# Patient Record
Sex: Female | Born: 2005 | Race: Black or African American | Hispanic: No | Marital: Single | Smoking: Never smoker
Health system: Southern US, Community
[De-identification: ages and names within clinical notes are randomized; demographics above are authoritative.]

## PROBLEM LIST (undated history)

## (undated) DIAGNOSIS — T7840XA Allergy, unspecified, initial encounter: Secondary | ICD-10-CM

## (undated) HISTORY — DX: Allergy, unspecified, initial encounter: T78.40XA

---
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
## 2024-03-05 ENCOUNTER — Ambulatory Visit
Admission: EM | Admit: 2024-03-05 | Discharge: 2024-03-05 | Disposition: A | Discharge status: Home / Self Care | Payer: Self-pay | Source: Home / Self Care

## 2024-03-05 ENCOUNTER — Ambulatory Visit (INDEPENDENT_AMBULATORY_CARE_PROVIDER_SITE_OTHER): Payer: Self-pay

## 2024-03-05 DIAGNOSIS — M79671 Pain in right foot: Secondary | ICD-10-CM

## 2024-03-05 LAB — POCT URINE PREGNANCY: Preg Test, Ur: NEGATIVE

## 2024-03-05 MED ORDER — PREDNISONE 20 MG PO TABS: 40.0000 mg | ORAL_TABLET | Freq: Every day | ORAL | 0 refills | Status: AC

## 2024-03-05 NOTE — Discharge Instructions (Signed)
 Your x-ray did not show any broken bones today.  Continue compression wraps, ice, elevation, supportive shoes and I have sent in a short course of prednisone  to help with inflammation.  If this does not resolve your symptoms I would recommend following up with [facility] or another podiatrist of choice for further evaluation

## 2024-03-05 NOTE — ED Triage Notes (Signed)
 Pt reports intermittent right foot pain described as sharp x 2 weeks, over the last 2 days pain has increased and been consistent. Pain is in the arch of the foot.
